# Patient Record
Sex: Male | Born: 2000 | Race: Black or African American | Hispanic: No | Marital: Single | Smoking: Never smoker
Health system: Southern US, Community
[De-identification: ages and names within clinical notes are randomized; demographics above are authoritative.]

---
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
## 2019-01-11 ENCOUNTER — Other Ambulatory Visit: Payer: Self-pay

## 2019-01-11 DIAGNOSIS — Z20822 Contact with and (suspected) exposure to covid-19: Secondary | ICD-10-CM

## 2019-01-12 LAB — NOVEL CORONAVIRUS, NAA: SARS-CoV-2, NAA: NOT DETECTED

## 2019-05-14 ENCOUNTER — Ambulatory Visit: Payer: Medicaid Other | Attending: Internal Medicine

## 2019-05-14 DIAGNOSIS — Z20822 Contact with and (suspected) exposure to covid-19: Secondary | ICD-10-CM

## 2019-05-15 LAB — NOVEL CORONAVIRUS, NAA: SARS-CoV-2, NAA: DETECTED — AB

## 2019-05-21 ENCOUNTER — Other Ambulatory Visit: Payer: Medicaid Other

## 2019-05-22 ENCOUNTER — Other Ambulatory Visit: Payer: Medicaid Other

## 2019-06-04 ENCOUNTER — Ambulatory Visit: Payer: Medicaid Other | Attending: Internal Medicine

## 2019-06-04 DIAGNOSIS — Z20822 Contact with and (suspected) exposure to covid-19: Secondary | ICD-10-CM

## 2019-06-05 LAB — NOVEL CORONAVIRUS, NAA: SARS-CoV-2, NAA: NOT DETECTED

## 2020-06-17 ENCOUNTER — Emergency Department (HOSPITAL_COMMUNITY)
Admission: EM | Admit: 2020-06-17 | Discharge: 2020-06-17 | Disposition: A | Discharge status: Home / Self Care | Payer: Medicaid Other | Attending: Emergency Medicine | Admitting: Emergency Medicine

## 2020-06-17 ENCOUNTER — Other Ambulatory Visit: Payer: Self-pay

## 2020-06-17 DIAGNOSIS — R1013 Epigastric pain: Secondary | ICD-10-CM | POA: Diagnosis not present

## 2020-06-17 DIAGNOSIS — R509 Fever, unspecified: Secondary | ICD-10-CM | POA: Diagnosis present

## 2020-06-17 DIAGNOSIS — Z20822 Contact with and (suspected) exposure to covid-19: Secondary | ICD-10-CM | POA: Insufficient documentation

## 2020-06-17 DIAGNOSIS — B349 Viral infection, unspecified: Secondary | ICD-10-CM | POA: Insufficient documentation

## 2020-06-17 LAB — BASIC METABOLIC PANEL
Anion gap: 14 (ref 5–15)
BUN: 18 mg/dL (ref 6–20)
CO2: 21 mmol/L — ABNORMAL LOW (ref 22–32)
Calcium: 9.5 mg/dL (ref 8.9–10.3)
Chloride: 102 mmol/L (ref 98–111)
Creatinine, Ser: 1.03 mg/dL (ref 0.61–1.24)
GFR, Estimated: >60 mL/min (ref >60)
Glucose, Bld: 132 mg/dL — ABNORMAL HIGH (ref 70–99)
Potassium: 3.5 mmol/L (ref 3.5–5.1)
Sodium: 137 mmol/L (ref 135–145)

## 2020-06-17 LAB — CBC
HCT: 44.7 % (ref 39.0–52.0)
Hemoglobin: 14.7 g/dL (ref 13.0–17.0)
MCH: 26.9 pg (ref 26.0–34.0)
MCHC: 32.9 g/dL (ref 30.0–36.0)
MCV: 81.7 fL (ref 80.0–100.0)
Platelets: 267 10*3/uL (ref 150–400)
RBC: 5.47 MIL/uL (ref 4.22–5.81)
RDW: 12.6 % (ref 11.5–15.5)
WBC: 6.4 10*3/uL (ref 4.0–10.5)
nRBC: 0 % (ref 0.0–0.2)

## 2020-06-17 LAB — SARS CORONAVIRUS 2 (TAT 6-24 HRS): SARS Coronavirus 2: NEGATIVE

## 2020-06-17 MED ORDER — ACETAMINOPHEN 500 MG PO TABS
1000.0000 mg | ORAL_TABLET | Freq: Once | ORAL | Status: AC
  Administered 2020-06-17: 1000 mg via ORAL
  Filled 2020-06-17: qty 2

## 2020-06-17 NOTE — ED Notes (Signed)
During triage, pt became diaphoretic & anxious, stating that his body felt "heavy." Pt given emesis bag, coached through breathing to calm anxiety.

## 2020-06-17 NOTE — ED Notes (Signed)
Patient verbalizes understanding of discharge instructions. Opportunity for questioning and answers were provided. Armband removed by staff, pt discharged from ED.  

## 2020-06-17 NOTE — ED Triage Notes (Signed)
C/o [facility] & dizziness beginning yesterday morning, states it got worse throughout day/night. Dizziness similar to previous concussions, denies recent injury, known sick/covid contact; was rapid tested Friday at school, negative result. Pt able to speak in complete sentences, RR e/u

## 2020-06-17 NOTE — Discharge Instructions (Addendum)
Your collection of symptoms involving fever, headache, epigastric discomfort, and diminished appetite is suggestive of viral illness.  Please continue to eat and drink regularly.  Take Tums or Pepto-Bismol as needed for upper abdominal discomfort or nausea symptoms.    Many cases can be observed and treated at home after initial evaluation in the emergency department. Even though you are being discharged home, abdominal pain can be unpredictable. Therefore, you need a repeat exam if your pain does not resolve, if it returns, or worsens. Most patient's with abdominal pain do not need to be admitted to the hospital or have surgery, but serious problems like appendicitis and gallbladder attacks can start out as nonspecific pain. Many abdominal conditions cannot be diagnosed in one visit, so follow-up evaluations are very important.  Your laboratory work-up obtained here in the ED was largely reassuring.  However, please have a low threshold to return to the ED or seek immediate medical attention should you experience any new or worsening symptoms.   Please seek immediate care if you have any develop any of the following symptoms: The pain does not improve or gets worse.  You develop a fever or chills.  You keep throwing up (vomiting).  The pain is felt only in portions of the abdomen.  You pass bloody or black tarry stools.   You do not have a bowel movement for more than 4 days.  You have profuse (greater than 4/day), loose stools for more than 4 days.

## 2020-06-17 NOTE — ED Provider Notes (Signed)
[facility] EMERGENCY DEPARTMENT Provider Note   CSN: [id number] Arrival date & time: 06/17/20  0148     History Chief Complaint  Patient presents with  . Shortness of Breath  . Dizziness    Anthony Strong is a 20 y.o. male with no significant past medical history presents the ED with a 2-day history of fever, lightheadedness, diminished appetite, headache, mild nausea, epigastric abdominal discomfort that is intermittent, and weakness.  He also feels dyspneic with exertion.  T-max at home 101 F.  He lives with his mother who has not been ill.  He is a Archivist.  Patient is denying any chest pain, difficulty breathing, hemoptysis, cough, runny nose or congestion, blurred vision, room spinning dizziness, ataxia, disequilibrium, meningismus, numbness or weakness, urinary symptoms, or changes in his bowel habits.  By time I examined the patient, he states that he was feeling better and that he felt sweaty and lightheaded earlier this morning, but it resolved.  I am evaluating patient 9 hours after arrival to the ED.  Patient denies any history of alcohol use or smoking.  He does not use any drugs.  He does not take any medications.  Patient states that he grew up seeing a pediatrician and has received all of his immunizations, including his meningitis vaccine.  HPI     No past medical history on file.  There are no problems to display for this patient.   No past surgical history on file.     No family history on file.  Social History   Tobacco Use  . Smoking status: Never Smoker  Substance Use Topics  . Alcohol use: No  . Drug use: No    Home Medications Prior to Admission medications   Medication Sig Start Date End Date Taking? Authorizing Provider  amoxicillin-clavulanate (AUGMENTIN) 875-125 MG per tablet Take 1 tablet by mouth 2 (two) times daily. Patient not taking: Reported on 02/25/2017 03/30/14   Reuben Likes, MD  azithromycin  (ZITHROMAX Z-PAK) 250 MG tablet Take as directed. Patient not taking: Reported on 02/25/2017 03/30/14   Reuben Likes, MD  ibuprofen (ADVIL,MOTRIN) 800 MG tablet Take 1 tablet (800 mg total) by mouth every 8 (eight) hours as needed. 02/26/17   Georgiana Shore, PA-C  traMADol Janean Sark) 50 MG tablet 1 to 2 every 8 hours as needed for cough Patient not taking: Reported on 02/25/2017 03/30/14   Reuben Likes, MD    Allergies    Patient has no known allergies.  Review of Systems   Review of Systems  All other systems reviewed and are negative.   Physical Exam Updated Vital Signs BP 124/78 (BP Location: Right Arm)   Pulse 66   Temp 98.7 F (37.1 C) (Oral)   Resp 17   SpO2 99%   Physical Exam Vitals and nursing note reviewed. Exam conducted with a chaperone present.  Constitutional:      General: He is not in acute distress.    Appearance: He is not ill-appearing or toxic-appearing.  HENT:     Head: Normocephalic and atraumatic.     Mouth/Throat:     Pharynx: Oropharynx is clear.  Eyes:     General: No scleral icterus.    Conjunctiva/sclera: Conjunctivae normal.  Neck:     Comments: No meningismus. Cardiovascular:     Rate and Rhythm: Normal rate.     Pulses: Normal pulses.     Heart sounds: Normal heart sounds. No murmur heard.  Pulmonary:     Effort: Pulmonary effort is normal. No respiratory distress.     Breath sounds: Normal breath sounds. No wheezing or rales.     Comments: No increased work of breathing.  Speaking in full sentences.  CTA bilaterally. Abdominal:     General: Abdomen is flat. There is no distension.     Palpations: Abdomen is soft.     Tenderness: There is no abdominal tenderness. There is no guarding.     Comments: No tenderness.  No guarding.  No peritoneal signs.  Negative Murphy's sign and McBurney's point tenderness.  Musculoskeletal:        General: Normal range of motion.     Cervical back: Normal range of motion. No rigidity.   Skin:    General: Skin is dry.  Neurological:     General: No focal deficit present.     Mental Status: He is alert and oriented to person, place, and time.     GCS: GCS eye subscore is 4. GCS verbal subscore is 5. GCS motor subscore is 6.  Psychiatric:        Mood and Affect: Mood normal.        Behavior: Behavior normal.        Thought Content: Thought content normal.      ED Results / Procedures / Treatments   Labs (all labs ordered are listed, but only abnormal results are displayed) Labs Reviewed  BASIC METABOLIC PANEL - Abnormal; Notable for the following components:      Result Value   CO2 21 (*)    Glucose, Bld 132 (*)    All other components within normal limits  SARS CORONAVIRUS 2 (TAT 6-24 HRS)  CBC    EKG EKG Interpretation  Date/Time:  Tuesday June 17 2020 02:00:59 EST Ventricular Rate:  99 PR Interval:  146 QRS Duration: 92 QT Interval:  316 QTC Calculation: 405 R Axis:   80 Text Interpretation: Normal sinus rhythm with sinus arrhythmia Nonspecific T wave abnormality Abnormal ECG No old tracing to compare Confirmed by Meridee Score [phone]) on 06/17/2020 10:16:07 AM   Radiology No results found.  Procedures Procedures   Medications Ordered in ED Medications  acetaminophen (TYLENOL) tablet 1,000 mg (1,000 mg Oral Given 06/17/20 0211)    ED Course  I have reviewed the triage vital signs and the nursing notes.  Pertinent labs & imaging results that were available during my care of the patient were reviewed by me and considered in my medical decision making (see chart for details).    MDM Rules/Calculators/A&P                          Anthony Strong was evaluated in Emergency Department on 06/17/2020 for the symptoms described in the history of present illness. He was evaluated in the context of the global COVID-19 pandemic, which necessitated consideration that the patient might be at risk for infection with the SARS-CoV-2 virus that causes  COVID-19. Institutional protocols and algorithms that pertain to the evaluation of patients at risk for COVID-19 are in a state of rapid change based on information released by regulatory bodies including the CDC and federal and state organizations. These policies and algorithms were followed during the patient's care in the ED.  I personally reviewed patient's medical chart and all notes from triage and staff during today's encounter. I have also ordered and reviewed all labs and imaging that I felt to be medically necessary  in the evaluation of this patient's complaints and with consideration of their physical exam. If needed, translation services were available and utilized.   Patient is here in the ED for mild diaphoresis and lightheadedness in the context of fever, unclear origin.  He denies any symptoms of upper respiratory infection.  No ear discomfort.  Patent oropharynx, no erythema.  Lung sounds clear to auscultation bilaterally.  His symptoms are mild and he reports that they are improved.  His laboratory work-up is reviewed and reassuring.  His vital signs are stable here in the ED and within normal limits.  Encouraging him to continue checking his temperature regularly and taking Tylenol as needed for fever control.  He did endorse some epigastric region abdominal discomfort that is intermittent and without any obvious aggravating or alleviating factors.  His exam was benign.  No leukocytosis.  I have lower suspicion for early appendicitis and discussed that CT imaging is not warranted at this time.  I suspect he has a viral illness.  Cannot exclude viral gastroenteritis, although no vomiting or diarrhea.  No meningismus and patient has received his meningitis vaccine.  No urinary symptoms.  Patient agrees.  However, discussed return precautions should his symptoms worsen.  In the interim, recommending Tums or Pepto-Bismol as needed for indigestion symptoms.  Patient is able to eat and drink.   Patient denies any chest pain or palpitations.  Feel as though outpatient follow-up with primary care provider is reasonable and he does not warrant urgent cardiology follow-up.  ED return precautions discussed.  Patient voices understanding and is agreeable to the plan.  Final Clinical Impression(s) / ED Diagnoses Final diagnoses:  Viral illness    Rx / DC Orders ED Discharge Orders    None       Lorelee New, PA-C 06/17/20 1124    Terrilee Files, MD 06/17/20 1718

## 2021-08-28 ENCOUNTER — Other Ambulatory Visit: Payer: Self-pay

## 2021-08-28 ENCOUNTER — Emergency Department (HOSPITAL_BASED_OUTPATIENT_CLINIC_OR_DEPARTMENT_OTHER)
Admission: EM | Admit: 2021-08-28 | Discharge: 2021-08-29 | Disposition: A | Discharge status: Home / Self Care | Payer: Medicaid Other | Attending: Emergency Medicine | Admitting: Emergency Medicine

## 2021-08-28 DIAGNOSIS — G4489 Other headache syndrome: Secondary | ICD-10-CM

## 2021-08-28 DIAGNOSIS — R519 Headache, unspecified: Secondary | ICD-10-CM | POA: Diagnosis present

## 2021-08-28 NOTE — ED Triage Notes (Signed)
C/O migraine headaches since last night;  ?

## 2021-08-29 MED ORDER — IBUPROFEN 400 MG PO TABS
400.0000 mg | ORAL_TABLET | Freq: Once | ORAL | Status: AC
  Administered 2021-08-29: 400 mg via ORAL
  Filled 2021-08-29: qty 1

## 2021-08-29 NOTE — Discharge Instructions (Signed)

## 2021-08-29 NOTE — ED Provider Notes (Signed)
?[facility] EMERGENCY DEPT ?Provider Note ? ? ?CSN: [id number] ?Arrival date & time: 08/28/21  1957 ? ?  ? ?History ? ?Chief Complaint  ?Patient presents with  ? Migraine  ? ? ?Anthony Strong is a 21 y.o. male. ? ?The history is provided by the patient and a significant other.  ?Migraine ?This is a new problem. The current episode started 12 to 24 hours ago. The problem occurs constantly. The problem has been gradually worsening. Associated symptoms include headaches. Pertinent negatives include no chest pain and no shortness of breath. Nothing aggravates the symptoms. Nothing relieves the symptoms.  ? ?  ?Patient is an otherwise healthy 21 year old male who presents with headache.  He thinks he has a migraine.  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  He reports very little sleep as he has a newborn at home, he is also has a job and is in college. ?The headache is currently a 7 out of 10.  No fevers or vomiting.  No visual changes.  No focal weakness. ?He reports it feels like when he had a previous concussion.  No recent falls or head injuries. ?No family history of stroke or aneurysms ?No recent travel ?Home Medications ?Prior to Admission medications   ?Not on File  ?   ? ?Allergies    ?Patient has no known allergies.   ? ?Review of Systems   ?Review of Systems  ?Constitutional:  Negative for fever.  ?Eyes:  Positive for photophobia. Negative for visual disturbance.  ?Respiratory:  Negative for shortness of breath.   ?Cardiovascular:  Negative for chest pain.  ?Gastrointestinal:  Negative for vomiting.  ?Neurological:  Positive for headaches. Negative for speech difficulty and weakness.  ? ?Physical Exam ?Updated Vital Signs ?BP (!) 142/91   Pulse 64   Temp 97.8 ?F (36.6 ?C) (Oral)   Resp 18   Ht 1.753 m (5\' 9" )   Wt 113.4 kg   SpO2 100%   BMI 36.92 kg/m?  ?Physical Exam ?CONSTITUTIONAL: Well developed/well nourished ?HEAD: Normocephalic/atraumatic ?EYES:  EOMI/PERRL, no nystagmus, no ptosis, normal fundoscopic exam (no papilledema)  ?ENMT: Mucous membranes moist ?NECK: supple no meningeal signs ?SPINE/BACK:entire spine nontender ?CV: S1/S2 noted, no murmurs/rubs/gallops noted ?LUNGS: Lungs are clear to auscultation bilaterally, no apparent distress ?ABDOMEN: soft, nontender, no rebound or guarding ?GU:no cva tenderness ?NEURO:Awake/alert, face symmetric, no arm or leg drift is noted ?Equal 5/5 strength with shoulder abduction, elbow flex/extension, wrist flex/extension in upper extremities and equal hand grips bilaterally ?Equal 5/5 strength with hip flexion,knee flex/extension, foot dorsi/plantar flexion ?Cranial nerves 3/4/5/6/11/15/08/11/12 tested and intact ?Gait normal without ataxia ?No past pointing ?Sensation to light touch intact in all extremities ?EXTREMITIES: pulses normal, full ROM ?SKIN: warm, color normal, no rash ?PSYCH: no abnormalities of mood noted, alert and oriented to situation ? ?ED Results / Procedures / Treatments   ?Labs ?(all labs ordered are listed, but only abnormal results are displayed) ?Labs Reviewed - No data to display ? ?EKG ?None ? ?Radiology ?No results found. ? ?Procedures ?Procedures  ? ? ?Medications Ordered in ED ?Medications  ?ibuprofen (ADVIL) tablet 400 mg (400 mg Oral Given 08/29/21 0042)  ? ? ?ED Course/ Medical Decision Making/ A&P ?Clinical Course as of 08/29/21 0134  ?Sat Aug 29, 2021  ?0129 Patient improved, no acute distress.  He is awake alert is appropriate.  No focal neurodeficits.  My suspicion for acute neurologic emergency is low [DW]  ?  ?Clinical Course User Index ?[DW]  Zadie Rhine, MD  ? ?                        ?Medical Decision Making ?Risk ?Prescription drug management. ? ? ?This patient presents to the ED for concern of headache, this involves an extensive number of treatment options, and is a complaint that carries with it a high risk of complications and morbidity.  The differential diagnosis  includes but is not limited to meningitis, subarachnoid hemorrhage, idiopathic intracranial hypertension, CVST, migraine ? ? ? ?Additional history obtained: ?Additional history obtained from significant other ? ?Medicines ordered and prescription drug management: ?I ordered medication including oral ibuprofen for headache ?Reevaluation of the patient after these medicines showed that the patient    improved ?Patient declined any other medicine ? ?Test Considered: ?Patient is overall healthy and very low risk for any acute neurologic emergency, will defer imaging ? ? ?Reevaluation: ?After the interventions noted above, I reevaluated the patient and found that they have :improved ? ?Complexity of problems addressed: ?Patient?s presentation is most consistent with  acute presentation with potential threat to life or bodily function ? ?Disposition: ?After consideration of the diagnostic results and the patient?s response to treatment,  ?I feel that the patent would benefit from discharge   .  ? ? ? ? ? ? ? ? ? ?Final Clinical Impression(s) / ED Diagnoses ?Final diagnoses:  ?Other headache syndrome  ? ? ?Rx / DC Orders ?ED Discharge Orders   ? ? None  ? ?  ? ? ?  ?Zadie Rhine, MD ?08/29/21 0134 ? ?
